# Patient Record
Sex: Female | Born: 1995 | Hispanic: Yes | Marital: Single | Smoking: Never smoker
Health system: Southern US, Community
[De-identification: ages and names within clinical notes are randomized; demographics above are authoritative.]

---
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
## 2015-05-24 DIAGNOSIS — Z111 Encounter for screening for respiratory tuberculosis: Secondary | ICD-10-CM | POA: Diagnosis not present

## 2016-01-05 DIAGNOSIS — H5213 Myopia, bilateral: Secondary | ICD-10-CM | POA: Diagnosis not present

## 2016-04-23 DIAGNOSIS — Z111 Encounter for screening for respiratory tuberculosis: Secondary | ICD-10-CM | POA: Diagnosis not present

## 2018-06-22 ENCOUNTER — Other Ambulatory Visit: Payer: Self-pay | Admitting: Family Medicine

## 2018-06-22 ENCOUNTER — Other Ambulatory Visit (HOSPITAL_COMMUNITY)
Admission: RE | Admit: 2018-06-22 | Discharge: 2018-06-22 | Disposition: A | Discharge status: Home / Self Care | Payer: No Typology Code available for payment source | Source: Ambulatory Visit | Attending: Family Medicine | Admitting: Family Medicine

## 2018-06-22 DIAGNOSIS — Z124 Encounter for screening for malignant neoplasm of cervix: Secondary | ICD-10-CM | POA: Diagnosis not present

## 2018-06-25 LAB — CYTOLOGY - PAP
Diagnosis: NEGATIVE
HPV: NOT DETECTED

## 2019-07-07 ENCOUNTER — Other Ambulatory Visit (HOSPITAL_COMMUNITY)
Admission: RE | Admit: 2019-07-07 | Discharge: 2019-07-07 | Disposition: A | Discharge status: Home / Self Care | Payer: No Typology Code available for payment source | Source: Ambulatory Visit | Attending: Family Medicine | Admitting: Family Medicine

## 2019-07-07 ENCOUNTER — Other Ambulatory Visit: Payer: Self-pay | Admitting: Family Medicine

## 2019-07-07 DIAGNOSIS — Z124 Encounter for screening for malignant neoplasm of cervix: Secondary | ICD-10-CM | POA: Insufficient documentation

## 2019-07-09 LAB — CYTOLOGY - PAP
Chlamydia: NEGATIVE
Comment: NEGATIVE
Comment: NORMAL
Diagnosis: NEGATIVE
Neisseria Gonorrhea: NEGATIVE

## 2019-07-26 ENCOUNTER — Other Ambulatory Visit: Payer: Self-pay

## 2019-07-26 ENCOUNTER — Ambulatory Visit: Payer: No Typology Code available for payment source | Attending: Family Medicine | Admitting: Audiologist

## 2019-07-26 DIAGNOSIS — H9193 Unspecified hearing loss, bilateral: Secondary | ICD-10-CM | POA: Diagnosis not present

## 2019-07-26 NOTE — Procedures (Signed)
  Outpatient Audiology and [facility] [address] [place], [place]  [address] [phone]  AUDIOLOGICAL  EVALUATION  NAME: Darlene Peterson     DOB:   11/17/1995      MRN: [id number]                                                                                     DATE: 07/26/2019     REFERENT: No primary care provider on file. STATUS: Outpatient DIAGNOSIS: Decreased Hearing in Noise    History: [name] was seen for an audiological evaluation. [name] is receiving a hearing evaluation due to concerns for her hearing at work . [name] has difficulty hearing in background noise. This difficulty began gradually. No pain or pressure reported in either ear. Occasional ringing reported which lasts only minute and occurs about once month. [name] denies any history of noise exposure. No known ear infections as a child. No family history of hearing loss. No medical history with risk factors for hearing loss.  No other relevant case history reported.   Evaluation:   Otoscopy showed a clear view of the tympanic membranes, bilaterally  Tympanometry results were consistent with normal middle ear function, bilaterally   Audiometric testing was completed using conventional audiometry with headphones transducer. Speech Reception Thresholds were consistent with pure tone averages. Word Recognition was excellent at conversation level. Pure tone thresholds show normal hearing in both ears. The Quick Speech in Noise test score of 3.5 SNR shows mild SNR loss. [name] may hear almost as well as normal in noise but some difficulty expected.   Results:  The test results were reviewed with [name] . She has normal hearing in both ears with a mild difficulty hearing in noise. There are online speech in noise training programs that can help increase her ability to discriminate speech in the presence of background noise. There is no need for amplification in either ear. The occasional ringing in transient ear  noise and is normal and not tinnitus.   Recommendations: 1.   No further audiologic testing is needed unless future hearing concerns arise. 2.   The LACE auditory training programs uses auditory rehabilitation tactics to increase users ability to hear in the presence in background. See the link below for details.   [email or web address]    Ammie Ferrier  Audiologist, Au.D., CCC-A  07/26/2019  2:29 PM

## 2019-08-19 ENCOUNTER — Other Ambulatory Visit: Payer: Self-pay | Admitting: Family Medicine

## 2019-08-19 ENCOUNTER — Ambulatory Visit
Admission: RE | Admit: 2019-08-19 | Discharge: 2019-08-19 | Disposition: A | Discharge status: Home / Self Care | Payer: No Typology Code available for payment source | Source: Ambulatory Visit | Attending: Family Medicine | Admitting: Family Medicine

## 2019-08-19 DIAGNOSIS — R1031 Right lower quadrant pain: Secondary | ICD-10-CM

## 2019-08-19 MED ORDER — IOPAMIDOL (ISOVUE-300) INJECTION 61%
100.0000 mL | Freq: Once | INTRAVENOUS | Status: AC | PRN
  Administered 2019-08-19: 100 mL via INTRAVENOUS

## 2019-12-23 ENCOUNTER — Encounter (HOSPITAL_COMMUNITY): Payer: Self-pay | Admitting: Emergency Medicine

## 2019-12-23 ENCOUNTER — Emergency Department (HOSPITAL_COMMUNITY): Payer: No Typology Code available for payment source

## 2019-12-23 ENCOUNTER — Other Ambulatory Visit: Payer: Self-pay

## 2019-12-23 ENCOUNTER — Emergency Department (HOSPITAL_COMMUNITY)
Admission: EM | Admit: 2019-12-23 | Discharge: 2019-12-23 | Disposition: A | Discharge status: Home / Self Care | Payer: No Typology Code available for payment source | Attending: Emergency Medicine | Admitting: Emergency Medicine

## 2019-12-23 DIAGNOSIS — N72 Inflammatory disease of cervix uteri: Secondary | ICD-10-CM | POA: Diagnosis not present

## 2019-12-23 DIAGNOSIS — R1031 Right lower quadrant pain: Secondary | ICD-10-CM | POA: Diagnosis present

## 2019-12-23 LAB — URINALYSIS, ROUTINE W REFLEX MICROSCOPIC
Bilirubin Urine: NEGATIVE
Glucose, UA: NEGATIVE mg/dL
Hgb urine dipstick: NEGATIVE
Ketones, ur: 5 mg/dL — AB
Leukocytes,Ua: NEGATIVE
Nitrite: NEGATIVE
Protein, ur: 30 mg/dL — AB
Specific Gravity, Urine: 1.029 (ref 1.005–1.030)
pH: 5 (ref 5.0–8.0)

## 2019-12-23 LAB — WET PREP, GENITAL
Clue Cells Wet Prep HPF POC: NONE SEEN
Sperm: NONE SEEN
Trich, Wet Prep: NONE SEEN
Yeast Wet Prep HPF POC: NONE SEEN

## 2019-12-23 LAB — GC/CHLAMYDIA PROBE AMP (~~LOC~~) NOT AT ARMC
Chlamydia: NEGATIVE
Comment: NEGATIVE
Comment: NORMAL
Neisseria Gonorrhea: NEGATIVE

## 2019-12-23 LAB — CBC
HCT: 43.2 % (ref 36.0–46.0)
Hemoglobin: 14.8 g/dL (ref 12.0–15.0)
MCH: 30.8 pg (ref 26.0–34.0)
MCHC: 34.3 g/dL (ref 30.0–36.0)
MCV: 90 fL (ref 80.0–100.0)
Platelets: 260 10*3/uL (ref 150–400)
RBC: 4.8 MIL/uL (ref 3.87–5.11)
RDW: 11.7 % (ref 11.5–15.5)
WBC: 19.3 10*3/uL — ABNORMAL HIGH (ref 4.0–10.5)
nRBC: 0 % (ref 0.0–0.2)

## 2019-12-23 LAB — COMPREHENSIVE METABOLIC PANEL
ALT: 19 U/L (ref 0–44)
AST: 22 U/L (ref 15–41)
Albumin: 4.2 g/dL (ref 3.5–5.0)
Alkaline Phosphatase: 43 U/L (ref 38–126)
Anion gap: 10 (ref 5–15)
BUN: 17 mg/dL (ref 6–20)
CO2: 25 mmol/L (ref 22–32)
Calcium: 8.9 mg/dL (ref 8.9–10.3)
Chloride: 102 mmol/L (ref 98–111)
Creatinine, Ser: 0.87 mg/dL (ref 0.44–1.00)
GFR, Estimated: >60 mL/min (ref >60)
Glucose, Bld: 99 mg/dL (ref 70–99)
Potassium: 4 mmol/L (ref 3.5–5.1)
Sodium: 137 mmol/L (ref 135–145)
Total Bilirubin: 0.7 mg/dL (ref 0.3–1.2)
Total Protein: 7.8 g/dL (ref 6.5–8.1)

## 2019-12-23 LAB — LIPASE, BLOOD: Lipase: 32 U/L (ref 11–51)

## 2019-12-23 LAB — I-STAT BETA HCG BLOOD, ED (MC, WL, AP ONLY): I-stat hCG, quantitative: <5 m[IU]/mL (ref <5)

## 2019-12-23 MED ORDER — DOXYCYCLINE HYCLATE 100 MG PO TABS
100.0000 mg | ORAL_TABLET | Freq: Once | ORAL | Status: AC
  Administered 2019-12-23: 100 mg via ORAL
  Filled 2019-12-23: qty 1

## 2019-12-23 MED ORDER — DEXTROSE 5 % IV SOLN
500.0000 mg | Freq: Once | INTRAVENOUS | Status: AC
  Administered 2019-12-23: 500 mg via INTRAVENOUS
  Filled 2019-12-23: qty 5

## 2019-12-23 MED ORDER — SODIUM CHLORIDE (PF) 0.9 % IJ SOLN
INTRAMUSCULAR | Status: AC
  Filled 2019-12-23: qty 50

## 2019-12-23 MED ORDER — DOXYCYCLINE HYCLATE 100 MG PO CAPS: 100.0000 mg | ORAL_CAPSULE | Freq: Two times a day (BID) | ORAL | 0 refills | Status: AC

## 2019-12-23 MED ORDER — MORPHINE SULFATE (PF) 4 MG/ML IV SOLN
4.0000 mg | Freq: Once | INTRAVENOUS | Status: AC
  Administered 2019-12-23: 4 mg via INTRAVENOUS
  Filled 2019-12-23: qty 1

## 2019-12-23 MED ORDER — ONDANSETRON HCL 4 MG/2ML IJ SOLN
4.0000 mg | Freq: Once | INTRAMUSCULAR | Status: AC
  Administered 2019-12-23: 4 mg via INTRAVENOUS
  Filled 2019-12-23: qty 2

## 2019-12-23 MED ORDER — IOHEXOL 300 MG/ML  SOLN
100.0000 mL | Freq: Once | INTRAMUSCULAR | Status: AC | PRN
  Administered 2019-12-23: 100 mL via INTRAVENOUS

## 2019-12-23 NOTE — Discharge Instructions (Signed)
Follow up with your family doc or obgyn.

## 2019-12-23 NOTE — ED Triage Notes (Signed)
Patient is complaining of right lower abdominal pain that started at 2 am. Patient states after lunch yesterday she threw up.

## 2019-12-23 NOTE — ED Provider Notes (Signed)
[facility]-EMERGENCY DEPT Provider Note   CSN: [id number] Arrival date & time: 12/23/19  0346     History Chief Complaint  Patient presents with  . Abdominal Pain    Darlene Peterson is a 24 y.o. female.  24 yo F with a chief complaints of abdominal pain.  This started abruptly at 2 AM.  Woke her up from sleep.  Pain was initially around her umbilicus and she feels like it is moved to the right lower quadrant.  No nausea or vomiting no fevers.  Denies constipation or diarrhea.  Denies urinary symptoms.  Denies vaginal bleeding or discharge.  She had one episode of emesis spontaneously yesterday after lunch.  No abdominal pain at that time.  The history is provided by the patient.  Abdominal Pain Pain location:  RLQ Pain quality: sharp and shooting   Pain radiates to:  Does not radiate Pain severity:  Moderate Onset quality:  Gradual Duration:  2 hours Timing:  Constant Progression:  Resolved Chronicity:  New Relieved by:  Nothing Worsened by:  Nothing Ineffective treatments:  None tried Associated symptoms: vomiting (once yesterday)   Associated symptoms: no chest pain, no chills, no dysuria, no fever, no nausea and no shortness of breath        History reviewed. No pertinent past medical history.  There are no problems to display for this patient.   History reviewed. No pertinent surgical history.   OB History   No obstetric history on file.     History reviewed. No pertinent family history.  Social History   Tobacco Use  . Smoking status: Never Smoker  . Smokeless tobacco: Never Used  Vaping Use  . Vaping Use: Never used  Substance Use Topics  . Alcohol use: Never  . Drug use: Never    Home Medications Prior to Admission medications   Medication Sig Start Date End Date Taking? Authorizing Provider  AUROVELA FE 1.5/30 1.5-30 MG-MCG tablet Take 1 tablet by mouth daily. 11/29/19  Yes [provider]  doxycycline  (VIBRAMYCIN) 100 MG capsule Take 1 capsule (100 mg total) by mouth 2 (two) times daily for 14 days. One po bid x 7 days 12/23/19 01/06/20  Melene Plan, DO    Allergies    Patient has no known allergies.  Review of Systems   Review of Systems  Constitutional: Negative for chills and fever.  HENT: Negative for congestion and rhinorrhea.   Eyes: Negative for redness and visual disturbance.  Respiratory: Negative for shortness of breath and wheezing.   Cardiovascular: Negative for chest pain and palpitations.  Gastrointestinal: Positive for abdominal pain and vomiting (once yesterday). Negative for nausea.  Genitourinary: Negative for dysuria and urgency.  Musculoskeletal: Negative for arthralgias and myalgias.  Skin: Negative for pallor and wound.  Neurological: Negative for dizziness and headaches.    Physical Exam Updated Vital Signs BP 105/74   Pulse 77   Temp 98 F (36.7 C) (Oral)   Resp 16   Ht 5\' 5"  (1.651 m)   Wt 61.2 kg   LMP 12/17/2019 Comment: negative beta HCG 12/23/19  SpO2 98%   BMI 22.47 kg/m   Physical Exam Vitals and nursing note reviewed.  Constitutional:      General: She is not in acute distress.    Appearance: She is well-developed. She is not diaphoretic.  HENT:     Head: Normocephalic and atraumatic.  Eyes:     Pupils: Pupils are equal, round, and reactive to light.  Cardiovascular:     Rate and Rhythm: Normal rate and regular rhythm.     Heart sounds: No murmur heard.  No friction rub. No gallop.   Pulmonary:     Effort: Pulmonary effort is normal.     Breath sounds: No wheezing or rales.  Abdominal:     General: There is no distension.     Palpations: Abdomen is soft.     Tenderness: There is abdominal tenderness (diffuse along the lower abdomen, worst to the RLQ). Positive signs include Murphy's sign. Negative signs include Rovsing's sign, McBurney's sign and psoas sign.  Genitourinary:    Comments: Purulent discharge from the cervix.   Tenderness with motion of the cervix and the uterus.  No adnexal masses or tenderness.  Erythema and easy friability of the cervix. Musculoskeletal:        General: No tenderness.     Cervical back: Normal range of motion and neck supple.  Skin:    General: Skin is warm and dry.  Neurological:     Mental Status: She is alert and oriented to person, place, and time.  Psychiatric:        Behavior: Behavior normal.     ED Results / Procedures / Treatments   Labs (all labs ordered are listed, but only abnormal results are displayed) Labs Reviewed  WET PREP, GENITAL - Abnormal; Notable for the following components:      Result Value   WBC, Wet Prep HPF POC MANY (*)    All other components within normal limits  CBC - Abnormal; Notable for the following components:   WBC 19.3 (*)    All other components within normal limits  URINALYSIS, ROUTINE W REFLEX MICROSCOPIC - Abnormal; Notable for the following components:   Color, Urine AMBER (*)    APPearance HAZY (*)    Ketones, ur 5 (*)    Protein, ur 30 (*)    Bacteria, UA FEW (*)    All other components within normal limits  LIPASE, BLOOD  COMPREHENSIVE METABOLIC PANEL  I-STAT BETA HCG BLOOD, ED ([facility], [facility], [facility] ONLY)  GC/CHLAMYDIA PROBE AMP ([place]) NOT AT [facility]    EKG None  Radiology No results found.  Procedures Procedures (including critical care time)  Medications Ordered in ED Medications  sodium chloride (PF) 0.9 % injection (  Not Given 12/23/19 0557)  cefTRIAXone (ROCEPHIN) 500 mg in dextrose 5 % 50 mL IVPB (has no administration in time range)  doxycycline (VIBRA-TABS) tablet 100 mg (has no administration in time range)  morphine 4 MG/ML injection 4 mg (4 mg Intravenous Given 12/23/19 0431)  ondansetron (ZOFRAN) injection 4 mg (4 mg Intravenous Given 12/23/19 0431)  iohexol (OMNIPAQUE) 300 MG/ML solution 100 mL (100 mLs Intravenous Contrast Given 12/23/19 0534)    ED Course  I have reviewed the triage vital signs  and the nursing notes.  Pertinent labs & imaging results that were available during my care of the patient were reviewed by me and considered in my medical decision making (see chart for details).    MDM Rules/Calculators/A&P                          24 yo F with a chief complaints of right lower quadrant abdominal pain.  Going on for about 2 hours now.  Pain is resolved.  She has some tenderness on abdominal exam all very low down in the abdomen worse in the right side.  Will  perform a pelvic exam.  Leukocytosis of 20,000.  Pelvic exam consistent with PID.  With significant leukocytosis will CT scan to assess for TOA.  CT scan negative for appendicitis or TOA.  Will discharge home on antibiotics.  PCP and OB/GYN follow-up.  6:48 AM:  I have discussed the diagnosis/risks/treatment options with the patient and believe the pt to be eligible for discharge home to follow-up with PCP, GYN. We also discussed returning to the ED immediately if new or worsening sx occur. We discussed the sx which are most concerning (e.g., sudden worsening pain, fever, inability to tolerate by mouth) that necessitate immediate return. Medications administered to the patient during their visit and any new prescriptions provided to the patient are listed below.  Medications given during this visit Medications  sodium chloride (PF) 0.9 % injection (  Not Given 12/23/19 0557)  cefTRIAXone (ROCEPHIN) 500 mg in dextrose 5 % 50 mL IVPB (has no administration in time range)  doxycycline (VIBRA-TABS) tablet 100 mg (has no administration in time range)  morphine 4 MG/ML injection 4 mg (4 mg Intravenous Given 12/23/19 0431)  ondansetron (ZOFRAN) injection 4 mg (4 mg Intravenous Given 12/23/19 0431)  iohexol (OMNIPAQUE) 300 MG/ML solution 100 mL (100 mLs Intravenous Contrast Given 12/23/19 0534)     The patient appears reasonably screen and/or stabilized for discharge and I doubt any other medical condition or other [facility] requiring  further screening, evaluation, or treatment in the ED at this time prior to discharge.   Final Clinical Impression(s) / ED Diagnoses Final diagnoses:  Cervicitis    Rx / DC Orders ED Discharge Orders         Ordered    doxycycline (VIBRAMYCIN) 100 MG capsule  2 times daily        12/23/19 0638           Melene Plan, DO 12/23/19 [id number]

## 2020-07-07 DIAGNOSIS — Z0000 Encounter for general adult medical examination without abnormal findings: Secondary | ICD-10-CM | POA: Diagnosis not present

## 2020-07-07 DIAGNOSIS — Z131 Encounter for screening for diabetes mellitus: Secondary | ICD-10-CM | POA: Diagnosis not present

## 2020-07-07 DIAGNOSIS — Z124 Encounter for screening for malignant neoplasm of cervix: Secondary | ICD-10-CM | POA: Diagnosis not present

## 2021-02-02 DIAGNOSIS — U071 COVID-19: Secondary | ICD-10-CM | POA: Diagnosis not present

## 2021-07-09 DIAGNOSIS — Z0000 Encounter for general adult medical examination without abnormal findings: Secondary | ICD-10-CM | POA: Diagnosis not present

## 2021-07-09 DIAGNOSIS — Z124 Encounter for screening for malignant neoplasm of cervix: Secondary | ICD-10-CM | POA: Diagnosis not present

## 2021-07-09 DIAGNOSIS — Z131 Encounter for screening for diabetes mellitus: Secondary | ICD-10-CM | POA: Diagnosis not present

## 2021-07-09 DIAGNOSIS — Z136 Encounter for screening for cardiovascular disorders: Secondary | ICD-10-CM | POA: Diagnosis not present

## 2021-09-11 IMAGING — CT CT ABD-PELV W/ CM
2 of 4 series · 17 of 46 positions shown, 19 images · IV contrast (OMNIPAQUE 300)
Comparison: 08/19/2019

CLINICAL DATA: Right lower quadrant pain with appendicitis
suspected

EXAM:
CT ABDOMEN AND PELVIS WITH CONTRAST
TECHNIQUE: Multidetector CT imaging of the abdomen and pelvis was performed
using the standard protocol following bolus administration of
intravenous contrast.
CONTRAST:  100mL OMNIPAQUE IOHEXOL 300 MG/ML  SOLN

[Series 2: axial st · axial · 0.68mm/px · z∈[+935,+1300]mm · 14 of 81 slices shown, 16 images]
[im 4/81  soft-tissue]
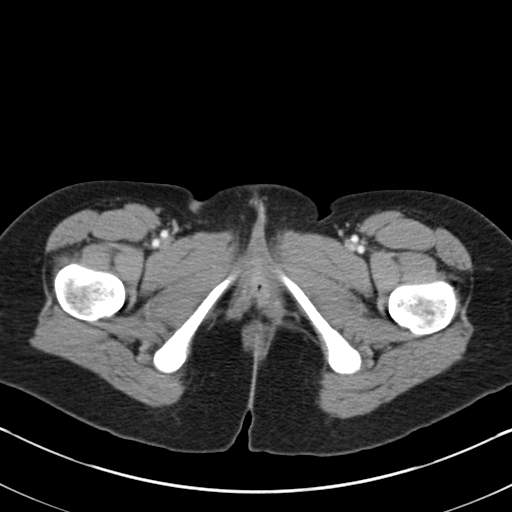
[im 4/81  bone]
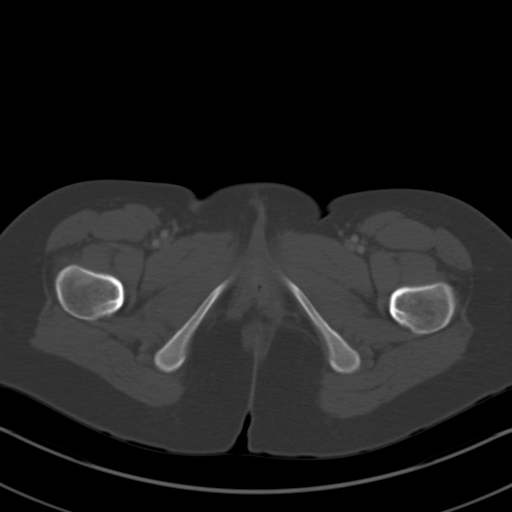
[im 11/81  soft-tissue]
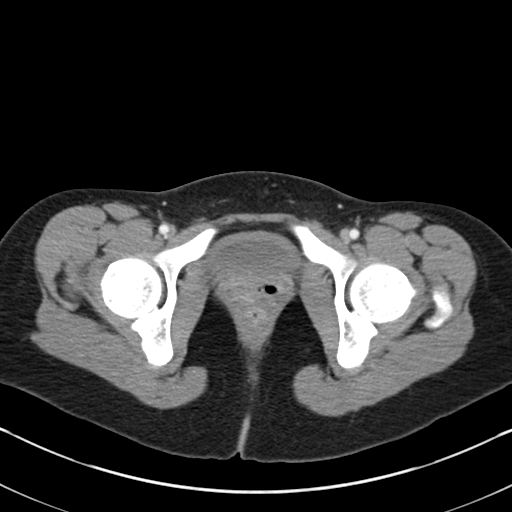
[im 14/81  soft-tissue]
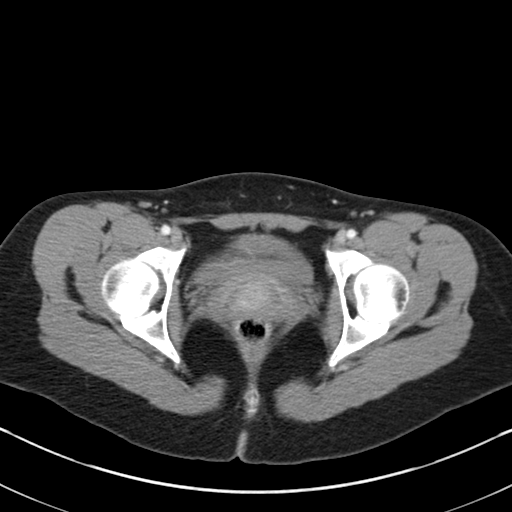
[im 21/81  soft-tissue]
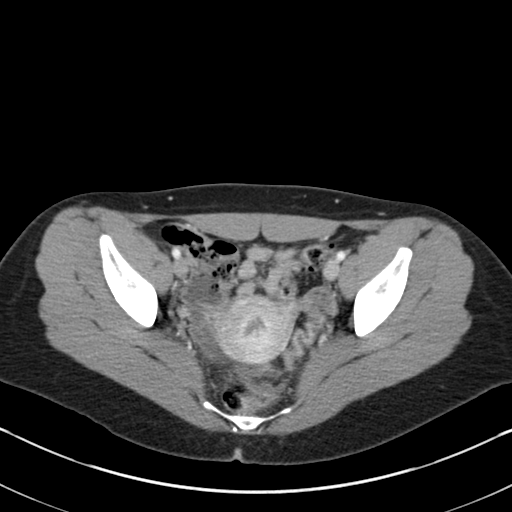
[im 28/81  soft-tissue]
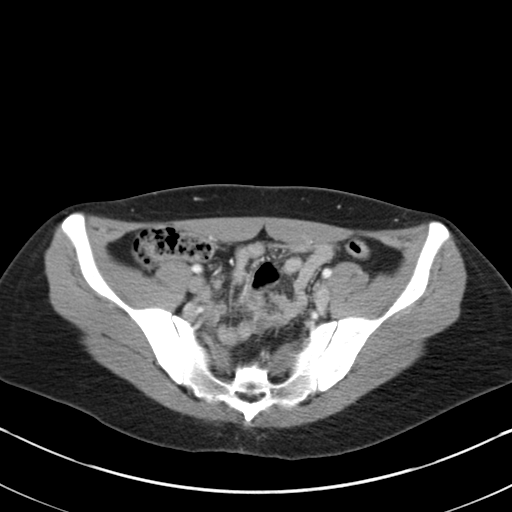
[im 32/81  soft-tissue]
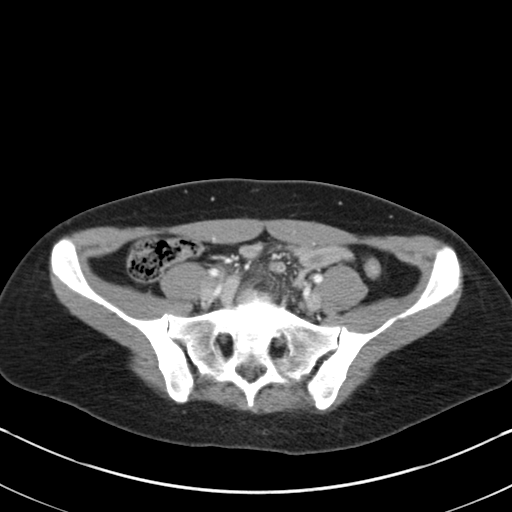
[im 39/81  soft-tissue]
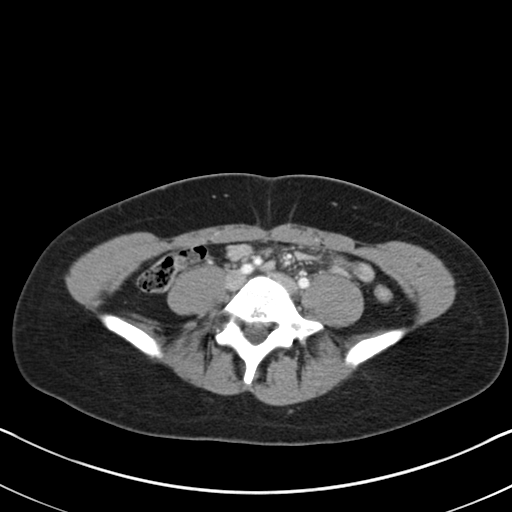
[im 42/81  soft-tissue]
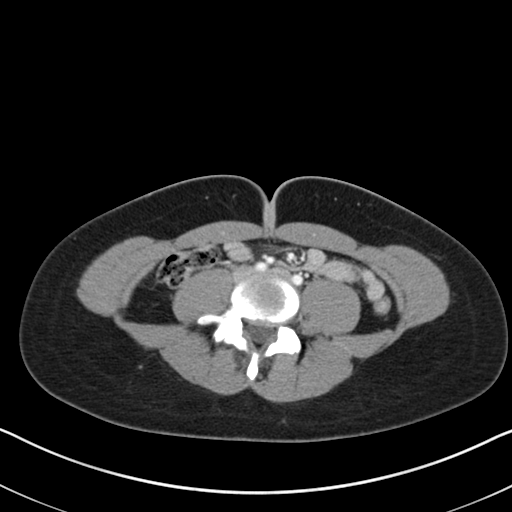
[im 49/81  soft-tissue]
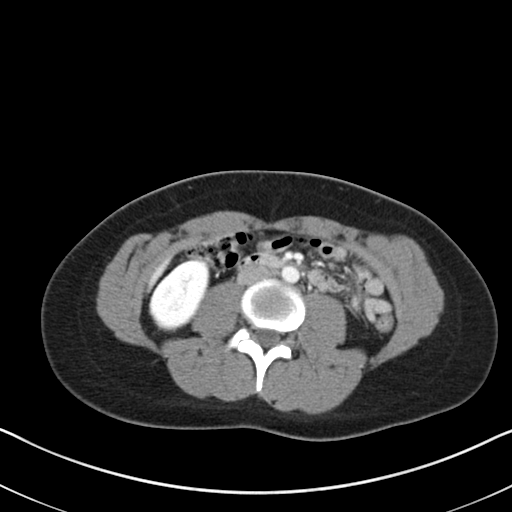
[im 49/81  bone]
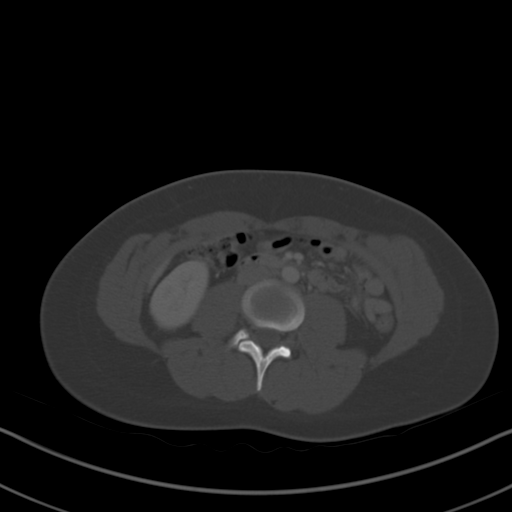
[im 53/81  soft-tissue]
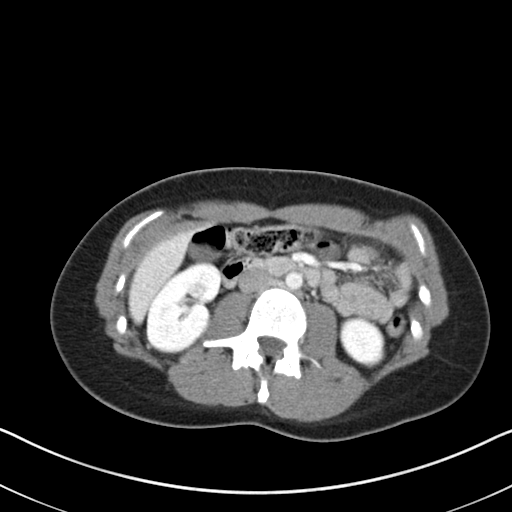
[im 60/81  soft-tissue]
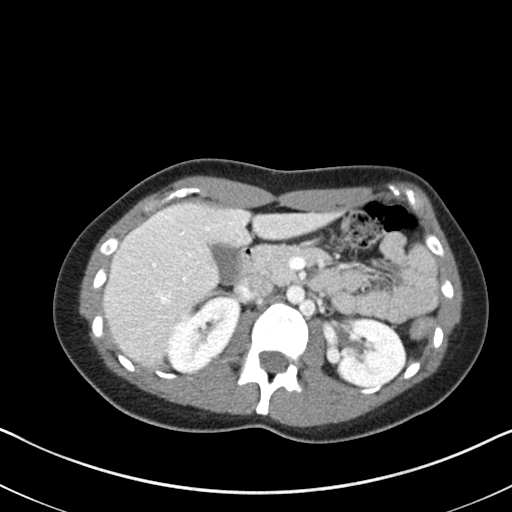
[im 67/81  soft-tissue]
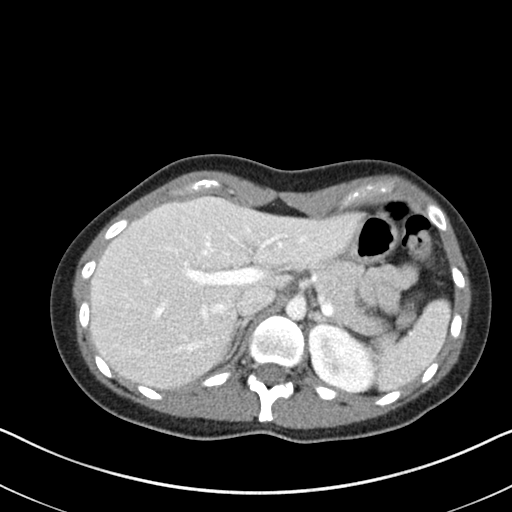
[im 70/81  soft-tissue]
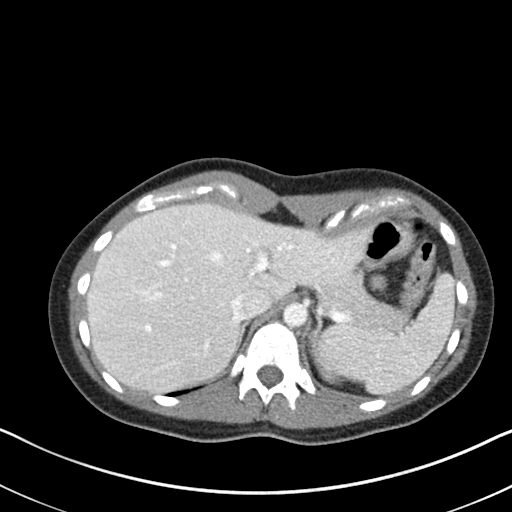
[im 77/81  soft-tissue]
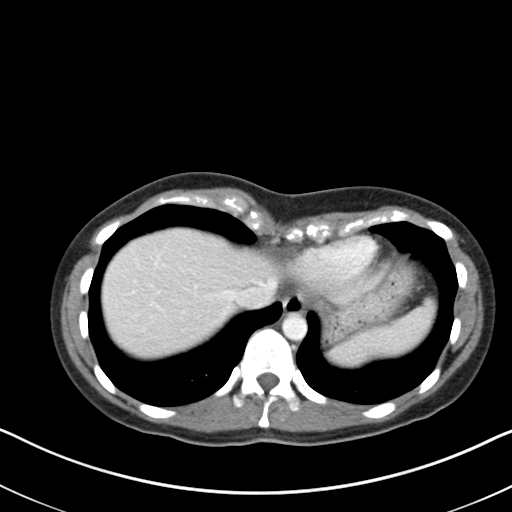

[Series 4: coronal st · coronal · 0.69mm/px · 3 of 101 slices shown]
[im 34/101  soft-tissue]
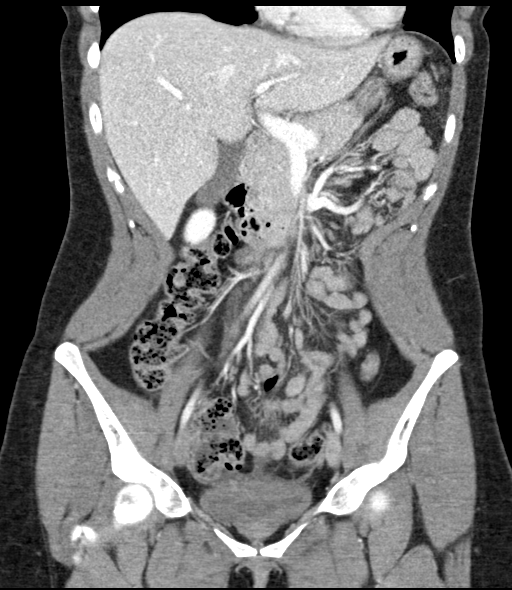
[im 45/101  soft-tissue]
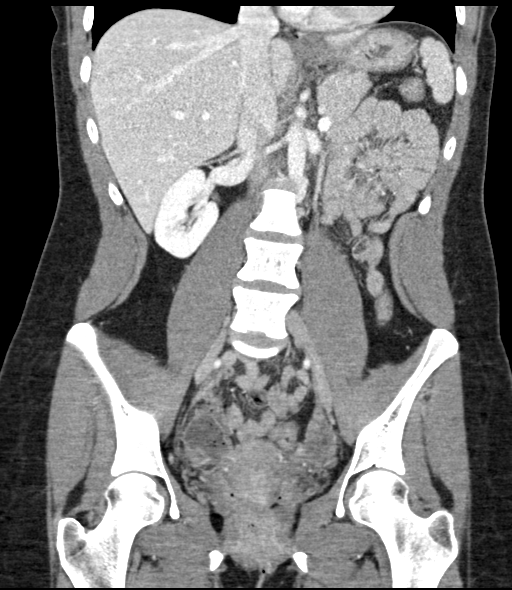
[im 56/101  soft-tissue]
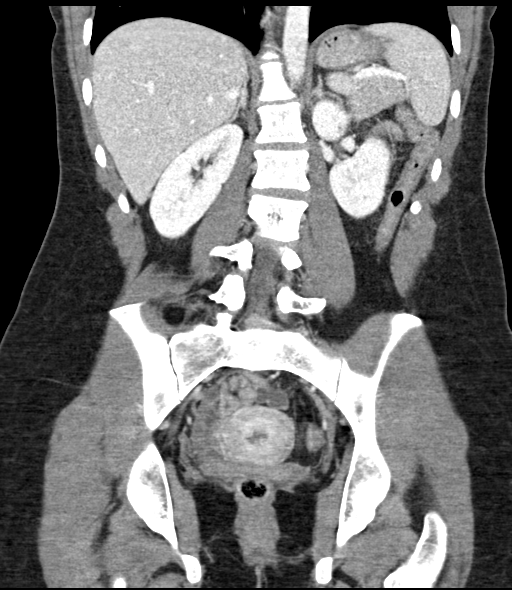

[17 of 46 positions shown; findings below may reference images not displayed]

FINDINGS: Lower chest:  Negative

Hepatobiliary: No focal liver abnormality.No evidence of biliary
obstruction or stone.

Pancreas: Unremarkable.

Spleen: Unremarkable.

Adrenals/Urinary Tract: Negative adrenals. No hydronephrosis or
stone. Unremarkable bladder.

Stomach/Bowel: No obstruction. The appendix extends superiorly from
the cecum and then is folded posteriorly. Wall thickness is 6-7 mm.
There is no detected change from before. No convincing
mesoappendiceal fat inflammation. No bowel inflammation of any kind
is seen in the abdomen.

Vascular/Lymphatic: No acute vascular abnormality. No mass or
adenopathy.

Reproductive:No pathologic findings.

Other: No ascites or pneumoperitoneum.

Musculoskeletal: No acute abnormalities.
IMPRESSION: Stable CT.  No evidence of appendicitis or other acute process.

## 2022-08-06 DIAGNOSIS — Z13220 Encounter for screening for lipoid disorders: Secondary | ICD-10-CM | POA: Diagnosis not present

## 2022-08-06 DIAGNOSIS — Z3202 Encounter for pregnancy test, result negative: Secondary | ICD-10-CM | POA: Diagnosis not present

## 2022-08-06 DIAGNOSIS — Z0000 Encounter for general adult medical examination without abnormal findings: Secondary | ICD-10-CM | POA: Diagnosis not present

## 2022-08-06 DIAGNOSIS — Z113 Encounter for screening for infections with a predominantly sexual mode of transmission: Secondary | ICD-10-CM | POA: Diagnosis not present

## 2022-10-10 ENCOUNTER — Ambulatory Visit: Payer: 59

## 2022-10-11 DIAGNOSIS — L42 Pityriasis rosea: Secondary | ICD-10-CM | POA: Diagnosis not present

## 2022-10-16 DIAGNOSIS — B09 Unspecified viral infection characterized by skin and mucous membrane lesions: Secondary | ICD-10-CM | POA: Diagnosis not present

## 2022-10-16 DIAGNOSIS — L299 Pruritus, unspecified: Secondary | ICD-10-CM | POA: Diagnosis not present

## 2023-04-03 DIAGNOSIS — R238 Other skin changes: Secondary | ICD-10-CM | POA: Diagnosis not present

## 2023-04-03 DIAGNOSIS — R8281 Pyuria: Secondary | ICD-10-CM | POA: Diagnosis not present

## 2023-04-03 DIAGNOSIS — N898 Other specified noninflammatory disorders of vagina: Secondary | ICD-10-CM | POA: Diagnosis not present

## 2023-04-03 DIAGNOSIS — R21 Rash and other nonspecific skin eruption: Secondary | ICD-10-CM | POA: Diagnosis not present

## 2023-08-08 DIAGNOSIS — E7800 Pure hypercholesterolemia, unspecified: Secondary | ICD-10-CM | POA: Diagnosis not present

## 2023-08-08 DIAGNOSIS — Z0000 Encounter for general adult medical examination without abnormal findings: Secondary | ICD-10-CM | POA: Diagnosis not present
